# Patient Record
Sex: Male | Born: 1950 | Race: White | Hispanic: No | Smoking: Never smoker
Health system: Southern US, Community
[De-identification: ages and names within clinical notes are randomized; demographics above are authoritative.]

## PROBLEM LIST (undated history)

## (undated) DIAGNOSIS — I10 Essential (primary) hypertension: Secondary | ICD-10-CM

## (undated) HISTORY — PX: APPENDECTOMY: SHX54

---

## 2009-06-12 ENCOUNTER — Encounter: Payer: Self-pay | Admitting: Emergency Medicine

## 2009-06-12 ENCOUNTER — Inpatient Hospital Stay (HOSPITAL_COMMUNITY): Admission: EM | Admit: 2009-06-12 | Discharge: 2009-06-13 | Payer: Self-pay | Admitting: Internal Medicine

## 2009-06-12 ENCOUNTER — Ambulatory Visit: Payer: Self-pay | Admitting: Diagnostic Radiology

## 2009-09-19 ENCOUNTER — Emergency Department (HOSPITAL_COMMUNITY): Admission: EM | Admit: 2009-09-19 | Discharge: 2009-09-23 | Payer: Self-pay | Admitting: Emergency Medicine

## 2009-09-21 DIAGNOSIS — F3110 Bipolar disorder, current episode manic without psychotic features, unspecified: Secondary | ICD-10-CM

## 2009-12-09 ENCOUNTER — Ambulatory Visit: Payer: Self-pay | Admitting: Radiology

## 2009-12-09 ENCOUNTER — Emergency Department (HOSPITAL_BASED_OUTPATIENT_CLINIC_OR_DEPARTMENT_OTHER): Admission: EM | Admit: 2009-12-09 | Discharge: 2009-12-09 | Payer: Self-pay | Admitting: Emergency Medicine

## 2010-01-09 ENCOUNTER — Ambulatory Visit: Payer: Self-pay | Admitting: Psychiatry

## 2010-05-19 LAB — CBC
HCT: 45.9 % (ref 39.0–52.0)
MCH: 32.2 pg (ref 26.0–34.0)
MCV: 92.3 fL (ref 78.0–100.0)
RBC: 4.98 MIL/uL (ref 4.22–5.81)
RDW: 12.3 % (ref 11.5–15.5)
WBC: 9.8 10*3/uL (ref 4.0–10.5)

## 2010-05-19 LAB — DIFFERENTIAL
Eosinophils Absolute: 0.7 10*3/uL (ref 0.0–0.7)
Eosinophils Relative: 7 % — ABNORMAL HIGH (ref 0–5)
Lymphocytes Relative: 31 % (ref 12–46)
Lymphs Abs: 3 10*3/uL (ref 0.7–4.0)
Monocytes Absolute: 0.7 10*3/uL (ref 0.1–1.0)

## 2010-05-19 LAB — URINALYSIS, ROUTINE W REFLEX MICROSCOPIC
Bilirubin Urine: NEGATIVE
Glucose, UA: NEGATIVE mg/dL
Ketones, ur: NEGATIVE mg/dL
pH: 6.5 (ref 5.0–8.0)

## 2010-05-19 LAB — RAPID URINE DRUG SCREEN, HOSP PERFORMED
Amphetamines: NOT DETECTED
Benzodiazepines: POSITIVE — AB
Cocaine: NOT DETECTED
Tetrahydrocannabinol: NOT DETECTED

## 2010-05-19 LAB — BASIC METABOLIC PANEL
Calcium: 9.2 mg/dL (ref 8.4–10.5)
Creatinine, Ser: 1.05 mg/dL (ref 0.4–1.5)
GFR calc Af Amer: >60 mL/min (ref >60)
GFR calc non Af Amer: >60 mL/min (ref >60)

## 2010-05-23 LAB — BASIC METABOLIC PANEL
Calcium: 9 mg/dL (ref 8.4–10.5)
GFR calc Af Amer: >60 mL/min (ref >60)
GFR calc non Af Amer: >60 mL/min (ref >60)
Glucose, Bld: 120 mg/dL — ABNORMAL HIGH (ref 70–99)
Sodium: 140 mEq/L (ref 135–145)

## 2010-05-23 LAB — POCT CARDIAC MARKERS
CKMB, poc: 2 ng/mL (ref 1.0–8.0)
Myoglobin, poc: 48.9 ng/mL (ref 12–200)
Myoglobin, poc: 75.2 ng/mL (ref 12–200)

## 2010-05-23 LAB — LIPID PANEL
HDL: 44 mg/dL (ref >39)
Total CHOL/HDL Ratio: 4.3 RATIO
Triglycerides: 252 mg/dL — ABNORMAL HIGH (ref <150)
VLDL: 50 mg/dL — ABNORMAL HIGH (ref 0–40)

## 2010-05-23 LAB — CBC
Hemoglobin: 15.3 g/dL (ref 13.0–17.0)
RDW: 11.4 % — ABNORMAL LOW (ref 11.5–15.5)

## 2010-05-23 LAB — TROPONIN I: Troponin I: <0.01 ng/mL (ref 0.00–0.06)

## 2010-05-23 LAB — DIFFERENTIAL
Basophils Absolute: 0.2 10*3/uL — ABNORMAL HIGH (ref 0.0–0.1)
Lymphocytes Relative: 28 % (ref 12–46)
Monocytes Absolute: 0.8 10*3/uL (ref 0.1–1.0)
Neutro Abs: 6.5 10*3/uL (ref 1.7–7.7)

## 2010-05-23 LAB — HEMOGLOBIN A1C
Hgb A1c MFr Bld: 5.5 % (ref 4.6–6.1)
Mean Plasma Glucose: 120 mg/dL

## 2010-05-23 LAB — CK TOTAL AND CKMB (NOT AT ARMC)
CK, MB: 2 ng/mL (ref 0.3–4.0)
Relative Index: 1.4 (ref 0.0–2.5)
Total CK: 148 U/L (ref 7–232)

## 2010-05-23 LAB — PHENYTOIN LEVEL, TOTAL: Phenytoin Lvl: 10.3 ug/mL (ref 10.0–20.0)

## 2017-04-17 ENCOUNTER — Other Ambulatory Visit: Payer: Self-pay

## 2017-04-17 ENCOUNTER — Emergency Department (HOSPITAL_BASED_OUTPATIENT_CLINIC_OR_DEPARTMENT_OTHER)
Admission: EM | Admit: 2017-04-17 | Discharge: 2017-04-17 | Disposition: A | Discharge status: Home / Self Care | Payer: Self-pay | Attending: Emergency Medicine | Admitting: Emergency Medicine

## 2017-04-17 ENCOUNTER — Emergency Department (HOSPITAL_BASED_OUTPATIENT_CLINIC_OR_DEPARTMENT_OTHER): Payer: Self-pay

## 2017-04-17 ENCOUNTER — Encounter (HOSPITAL_BASED_OUTPATIENT_CLINIC_OR_DEPARTMENT_OTHER): Payer: Self-pay | Admitting: Emergency Medicine

## 2017-04-17 DIAGNOSIS — R42 Dizziness and giddiness: Secondary | ICD-10-CM | POA: Insufficient documentation

## 2017-04-17 DIAGNOSIS — Z79899 Other long term (current) drug therapy: Secondary | ICD-10-CM | POA: Insufficient documentation

## 2017-04-17 DIAGNOSIS — I10 Essential (primary) hypertension: Secondary | ICD-10-CM | POA: Insufficient documentation

## 2017-04-17 DIAGNOSIS — J111 Influenza due to unidentified influenza virus with other respiratory manifestations: Secondary | ICD-10-CM | POA: Insufficient documentation

## 2017-04-17 DIAGNOSIS — R69 Illness, unspecified: Secondary | ICD-10-CM

## 2017-04-17 HISTORY — DX: Essential (primary) hypertension: I10

## 2017-04-17 LAB — CBC WITH DIFFERENTIAL/PLATELET
Basophils Absolute: 0 10*3/uL (ref 0.0–0.1)
Basophils Relative: 0 %
EOS ABS: 0 10*3/uL (ref 0.0–0.7)
EOS PCT: 0 %
HCT: 41.8 % (ref 39.0–52.0)
Hemoglobin: 14.8 g/dL (ref 13.0–17.0)
LYMPHS ABS: 1.1 10*3/uL (ref 0.7–4.0)
LYMPHS PCT: 16 %
MCH: 30.1 pg (ref 26.0–34.0)
MCHC: 35.4 g/dL (ref 30.0–36.0)
MCV: 85.1 fL (ref 78.0–100.0)
MONO ABS: 0.8 10*3/uL (ref 0.1–1.0)
MONOS PCT: 12 %
Neutro Abs: 5.1 10*3/uL (ref 1.7–7.7)
Neutrophils Relative %: 72 %
PLATELETS: 143 10*3/uL — AB (ref 150–400)
RBC: 4.91 MIL/uL (ref 4.22–5.81)
RDW: 13 % (ref 11.5–15.5)
WBC: 7.1 10*3/uL (ref 4.0–10.5)

## 2017-04-17 LAB — COMPREHENSIVE METABOLIC PANEL
ALK PHOS: 47 U/L (ref 38–126)
ALT: 68 U/L — ABNORMAL HIGH (ref 17–63)
ANION GAP: 10 (ref 5–15)
AST: 58 U/L — ABNORMAL HIGH (ref 15–41)
Albumin: 3.9 g/dL (ref 3.5–5.0)
BILIRUBIN TOTAL: 0.9 mg/dL (ref 0.3–1.2)
BUN: 19 mg/dL (ref 6–20)
CALCIUM: 8.5 mg/dL — AB (ref 8.9–10.3)
CO2: 25 mmol/L (ref 22–32)
Chloride: 99 mmol/L — ABNORMAL LOW (ref 101–111)
Creatinine, Ser: 1.09 mg/dL (ref 0.61–1.24)
GFR calc Af Amer: >60 mL/min (ref >60)
GFR calc non Af Amer: >60 mL/min (ref >60)
Glucose, Bld: 113 mg/dL — ABNORMAL HIGH (ref 65–99)
Potassium: 3.6 mmol/L (ref 3.5–5.1)
SODIUM: 134 mmol/L — AB (ref 135–145)
TOTAL PROTEIN: 7.2 g/dL (ref 6.5–8.1)

## 2017-04-17 LAB — TROPONIN I

## 2017-04-17 LAB — CBG MONITORING, ED: Glucose-Capillary: 98 mg/dL (ref 65–99)

## 2017-04-17 MED ORDER — SODIUM CHLORIDE 0.9 % IV BOLUS (SEPSIS)
1000.0000 mL | Freq: Once | INTRAVENOUS | Status: AC
  Administered 2017-04-17: 1000 mL via INTRAVENOUS

## 2017-04-17 NOTE — Discharge Instructions (Signed)
Your blood work shows that your liver enzymes are slightly high.  Please limit your tylenol use (do not use if possible) and do not drink alcohol.  Please follow up with your doctor in the next 1-2 days.  Please make sure you stay well hydrated.    Please be slow when going from sitting to standing or with position changes in general to allow your body time to adjust.

## 2017-04-17 NOTE — ED Provider Notes (Signed)
[facility] EMERGENCY DEPARTMENT Provider Note   CSN: [id number] Arrival date & time: 04/17/17  0931     History   Chief Complaint Chief Complaint  Patient presents with  . Generalized Body Aches    HPI Logan Bryant is a 67 y.o. male with a history of PTSD, hypertension, who presents today for evaluation of fevers and chills.  He reports that his symptoms started on Monday and have been gradually getting worse.  He reports that yesterday his temperature at home was 100.4.  He endorses cough, generalized ache in all of his joints, decreased appetite, poor p.o. intake.  He reports that today he decided to come in as he was feeling lightheaded with any position changes.  He reports that he fell twice because of this, both times onto his couch.  He denies any injuries, no headache, no neck pain.  He denies any visual changes.  He denies chest pain or shortness of breath.    HPI  Past Medical History:  Diagnosis Date  . Hypertension     There are no active problems to display for this patient.   History reviewed. No pertinent surgical history.     Home Medications    Prior to Admission medications   Medication Sig Start Date End Date Taking? Authorizing Provider  buPROPion (WELLBUTRIN) 100 MG tablet Take 150 mg by mouth 2 (two) times daily.   Yes [provider]  sertraline (ZOLOFT) 100 MG tablet Take 200 mg by mouth daily.   Yes [provider]    Family History History reviewed. No pertinent family history.  Social History Social History   Tobacco Use  . Smoking status: Never Smoker  . Smokeless tobacco: Never Used  Substance Use Topics  . Alcohol use: No    Frequency: Never  . Drug use: No     Allergies   Patient has no known allergies.   Review of Systems Review of Systems  Constitutional: Positive for activity change, appetite change, chills, fatigue and fever.  HENT: Negative for ear pain and sore throat.   Eyes: Negative  for pain and visual disturbance.  Respiratory: Negative for cough and shortness of breath.   Cardiovascular: Negative for chest pain and palpitations.  Gastrointestinal: Negative for abdominal pain, diarrhea, nausea and vomiting.  Genitourinary: Positive for decreased urine volume. Negative for discharge, dysuria and hematuria.  Musculoskeletal: Negative for arthralgias and back pain.  Skin: Negative for color change and rash.  Neurological: Negative for seizures and syncope.  All other systems reviewed and are negative.    Physical Exam Updated Vital Signs BP (!) 143/70   Pulse 72   Temp 98.1 F (36.7 C) (Oral)   Resp 17   Ht 5\' 11"  (1.803 m)   Wt 95.3 kg (210 lb)   SpO2 93%   BMI 29.29 kg/m   Physical Exam  Constitutional: He is oriented to person, place, and time. He appears well-developed and well-nourished. No distress.  HENT:  Head: Normocephalic and atraumatic.  Mouth/Throat: Oropharynx is clear and moist.  Eyes: Conjunctivae are normal. Right eye exhibits no discharge. Left eye exhibits no discharge. No scleral icterus.  Neck: Normal range of motion.  Cardiovascular: Normal rate, regular rhythm and normal heart sounds.  No murmur heard. Pulmonary/Chest: Effort normal. No stridor. No respiratory distress.  Abdominal: He exhibits no distension.  Musculoskeletal: He exhibits no edema or deformity.  Neurological: He is alert and oriented to person, place, and time. He exhibits normal muscle tone.  Mental Status:  Alert, oriented, thought content appropriate, able to give a coherent history. Speech fluent without evidence of aphasia. Able to follow 2 step commands without difficulty.  Cranial Nerves:  II:  Peripheral visual fields grossly normal, pupils equal, round, reactive to light III,IV, VI: ptosis not present, extra-ocular motions intact bilaterally  V,VII: smile symmetric, facial light touch sensation equal VIII: hearing grossly normal to voice  X: uvula  elevates symmetrically  XI: bilateral shoulder shrug symmetric and strong XII: midline tongue extension without fassiculations Motor:  Normal tone. 5/5 in upper and lower extremities bilaterally including strong and equal grip strength and dorsiflexion/plantar flexion Cerebellar: normal finger-to-nose with bilateral upper extremities Gait: normal gait and balance CV: distal pulses palpable throughout    Skin: Skin is warm and dry. He is not diaphoretic.  Psychiatric: He has a normal mood and affect. His behavior is normal.  Nursing note and vitals reviewed.    ED Treatments / Results  Labs (all labs ordered are listed, but only abnormal results are displayed) Labs Reviewed  COMPREHENSIVE METABOLIC PANEL - Abnormal; Notable for the following components:      Result Value   Sodium 134 (*)    Chloride 99 (*)    Glucose, Bld 113 (*)    Calcium 8.5 (*)    AST 58 (*)    ALT 68 (*)    All other components within normal limits  CBC WITH DIFFERENTIAL/PLATELET - Abnormal; Notable for the following components:   Platelets 143 (*)    All other components within normal limits  TROPONIN I  CBG MONITORING, ED    EKG  EKG Interpretation  Date/Time:  Thursday April 17 2017 10:14:33 EST Ventricular Rate:  70 PR Interval:    QRS Duration: 87 QT Interval:  399 QTC Calculation: 431 R Axis:   64 Text Interpretation:  Sinus rhythm Borderline low voltage, extremity leads No significant change since last tracing Confirmed by Frederick Peers [id number]) on 04/17/2017 10:21:24 AM Also confirmed by Frederick Peers [id number]), editor Barbette Hair [phone])  on 04/17/2017 11:16:13 AM       Radiology Dg Chest 2 View  Result Date: 04/17/2017 CLINICAL DATA:  Cough, fevers EXAM: CHEST  2 VIEW COMPARISON:  06/12/2009 FINDINGS: Heart and mediastinal contours are within normal limits. No focal opacities or effusions. No acute bony abnormality. IMPRESSION: No active cardiopulmonary disease. Electronically  Signed   By: Charlett Nose M.D.   On: 04/17/2017 10:24   Ct Head Wo Contrast  Result Date: 04/17/2017 CLINICAL DATA:  Disease this for several weeks.  Falls. EXAM: CT HEAD WITHOUT CONTRAST CT CERVICAL SPINE WITHOUT CONTRAST TECHNIQUE: Multidetector CT imaging of the head and cervical spine was performed following the standard protocol without intravenous contrast. Multiplanar CT image reconstructions of the cervical spine were also generated. COMPARISON:  None. FINDINGS: CT HEAD FINDINGS Brain: The brainstem, cerebellum, cerebral peduncles, thalami, basal ganglia, basilar cisterns, and ventricular system appear within normal limits. No intracranial hemorrhage, mass lesion, or acute CVA. Vascular: Unremarkable Skull: Unremarkable Sinuses/Orbits: Chronic bilateral maxillary, ethmoid, and sphenoid sinusitis. Other: No supplemental non-categorized findings. CT CERVICAL SPINE FINDINGS Alignment: No significant abnormal subluxation. Reversal of the normal cervical lordosis Skull base and vertebrae: No cervical spine fracture identified. Significant loss of intervertebral disc height at C5-6 and C6-7. Multilevel spondylosis. Soft tissues and spinal canal: No prevertebral soft tissue swelling. Scattered small lymph nodes in the neck bilaterally. Disc levels: Uncinate and facet spurring contribute to mild right foraminal stenosis at C3-4, C5-6,  and C6-7; and mild bony left foraminal stenosis at C3-4 and C5-6. Posterior osseous ridging contributes to borderline central narrowing of the thecal sac at C5-6 and C6-7. Upper chest: The lungs are not included. Other: No supplemental non-categorized findings. IMPRESSION: 1. No acute intracranial findings or acute cervical spine findings. 2. Chronic paranasal sinusitis. 3. Cervical spondylosis causing mild degrees of impingement at C3-4, C5-6, and C6-7. 4. Loss of the normal cervical lordosis, which can be associated with muscle spasm. Electronically Signed   By: Gaylyn Rong M.D.   On: 04/17/2017 10:38   Ct Cervical Spine Wo Contrast  Result Date: 04/17/2017 CLINICAL DATA:  Disease this for several weeks.  Falls. EXAM: CT HEAD WITHOUT CONTRAST CT CERVICAL SPINE WITHOUT CONTRAST TECHNIQUE: Multidetector CT imaging of the head and cervical spine was performed following the standard protocol without intravenous contrast. Multiplanar CT image reconstructions of the cervical spine were also generated. COMPARISON:  None. FINDINGS: CT HEAD FINDINGS Brain: The brainstem, cerebellum, cerebral peduncles, thalami, basal ganglia, basilar cisterns, and ventricular system appear within normal limits. No intracranial hemorrhage, mass lesion, or acute CVA. Vascular: Unremarkable Skull: Unremarkable Sinuses/Orbits: Chronic bilateral maxillary, ethmoid, and sphenoid sinusitis. Other: No supplemental non-categorized findings. CT CERVICAL SPINE FINDINGS Alignment: No significant abnormal subluxation. Reversal of the normal cervical lordosis Skull base and vertebrae: No cervical spine fracture identified. Significant loss of intervertebral disc height at C5-6 and C6-7. Multilevel spondylosis. Soft tissues and spinal canal: No prevertebral soft tissue swelling. Scattered small lymph nodes in the neck bilaterally. Disc levels: Uncinate and facet spurring contribute to mild right foraminal stenosis at C3-4, C5-6, and C6-7; and mild bony left foraminal stenosis at C3-4 and C5-6. Posterior osseous ridging contributes to borderline central narrowing of the thecal sac at C5-6 and C6-7. Upper chest: The lungs are not included. Other: No supplemental non-categorized findings. IMPRESSION: 1. No acute intracranial findings or acute cervical spine findings. 2. Chronic paranasal sinusitis. 3. Cervical spondylosis causing mild degrees of impingement at C3-4, C5-6, and C6-7. 4. Loss of the normal cervical lordosis, which can be associated with muscle spasm. Electronically Signed   By: Gaylyn Rong  M.D.   On: 04/17/2017 10:38    Procedures Procedures (including critical care time)  Medications Ordered in ED Medications  sodium chloride 0.9 % bolus 1,000 mL (0 mLs Intravenous Stopped 04/17/17 1155)  sodium chloride 0.9 % bolus 1,000 mL (0 mLs Intravenous Stopped 04/17/17 1359)     Initial Impression / Assessment and Plan / ED Course  I have reviewed the triage vital signs and the nursing notes.  Pertinent labs & imaging results that were available during my care of the patient were reviewed by me and considered in my medical decision making (see chart for details).  Clinical Course as of Apr 18 1739  Thu Apr 17, 2017  1149 Patient re-evaluated, does not feel less dizzy after 1 liter, will order second liter and orthostatic vitals  [EH]  1346 Patient reevaluated, his dizziness has fully resolved, he has urinated.  He is requesting discharge at this time.  [EH]    Clinical Course User Index [EH] Cristina Gong, PA-C   Patient with symptoms consistent with influenza.  Vitals are stable, low-grade fever.  No signs of dehydration, tolerating PO's.  Lungs are clear CXR with out acute abnormalities.   Discussed the cost versus benefit of Tamiflu treatment with the patient.  The patient understands that symptoms are greater than the recommended 24-48 hour window of treatment.  Patient will be discharged with instructions to orally hydrate, rest, and use over-the-counter medications such as anti-inflammatories ibuprofen and Aleve for muscle aches and Tylenol for fever.    Based on his falls, and dizziness combined with age EKG was obtained, normal troponin.  Head CT obtained along with CT neck with out acute abnormalities.  He was treated with IVF after which his dizziness resolved.  He was seen as a shared visit with Dr. Clarene Duke who agreed with my plan.  PCP follow up, return precautions discussed. Informed of elevated LFT, given instructions to avoid alcohol, limit tylenol use and PCP  follow up.    Final Clinical Impressions(s) / ED Diagnoses   Final diagnoses:  Influenza-like illness    ED Discharge Orders    None       Norman Clay 04/17/17 1743    Little, Ambrose Finland, MD 04/19/17 1610

## 2017-04-17 NOTE — ED Triage Notes (Signed)
Patient reports fever and chills since Monday.  Denies n/v/d.  Reports lack of appetite.  States fever at home of 104.  Reports generalized body aches and cough.

## 2018-03-11 ENCOUNTER — Encounter (HOSPITAL_BASED_OUTPATIENT_CLINIC_OR_DEPARTMENT_OTHER): Payer: Self-pay | Admitting: *Deleted

## 2018-03-11 ENCOUNTER — Emergency Department (HOSPITAL_BASED_OUTPATIENT_CLINIC_OR_DEPARTMENT_OTHER)
Admission: EM | Admit: 2018-03-11 | Discharge: 2018-03-11 | Disposition: A | Discharge status: Home / Self Care | Payer: No Typology Code available for payment source | Attending: Emergency Medicine | Admitting: Emergency Medicine

## 2018-03-11 ENCOUNTER — Other Ambulatory Visit: Payer: Self-pay

## 2018-03-11 DIAGNOSIS — I10 Essential (primary) hypertension: Secondary | ICD-10-CM | POA: Insufficient documentation

## 2018-03-11 DIAGNOSIS — T7840XA Allergy, unspecified, initial encounter: Secondary | ICD-10-CM | POA: Insufficient documentation

## 2018-03-11 DIAGNOSIS — B379 Candidiasis, unspecified: Secondary | ICD-10-CM | POA: Diagnosis not present

## 2018-03-11 DIAGNOSIS — R21 Rash and other nonspecific skin eruption: Secondary | ICD-10-CM | POA: Diagnosis present

## 2018-03-11 DIAGNOSIS — L299 Pruritus, unspecified: Secondary | ICD-10-CM | POA: Diagnosis not present

## 2018-03-11 DIAGNOSIS — Z79899 Other long term (current) drug therapy: Secondary | ICD-10-CM | POA: Insufficient documentation

## 2018-03-11 MED ORDER — PREDNISONE 20 MG PO TABS: 40.0000 mg | ORAL_TABLET | Freq: Every day | ORAL | 0 refills | Status: AC

## 2018-03-11 MED ORDER — FLUCONAZOLE 150 MG PO TABS: 150.0000 mg | ORAL_TABLET | Freq: Every day | ORAL | 0 refills | Status: AC

## 2018-03-11 MED ORDER — DIPHENHYDRAMINE HCL 25 MG PO TABS: 25.0000 mg | ORAL_TABLET | Freq: Four times a day (QID) | ORAL | 0 refills | Status: AC | PRN

## 2018-03-11 NOTE — ED Notes (Signed)
ED Provider at bedside. 

## 2018-03-11 NOTE — ED Provider Notes (Signed)
[facility] EMERGENCY DEPARTMENT Provider Note   CSN: [id number] Arrival date & time: 03/11/18  1257     History   Chief Complaint Chief Complaint  Patient presents with  . Allergic Reaction    HPI Logan Bryant is a 68 y.o. male with a past medical history significant for HTN who presents today complaining of rash for the past few days after he was started on new medication. Patient was started on Doxycycline for possible pneumonia at the [place] by PCP.  Patient stopped taking the medication on Thursday so developing a rash and pruritus on Friday.  Patient stopped taking the medication on Friday however the rash continue to spread with significant itching over the weekend.  [facility] in [facility] for further evaluation.  Patient also reports redness and discomfort around his penis consistent with prior fungal infection. Patient denies any difficulty breathing, swelling, wheezing, oral lesions.  Patient denies any chest pain, vision change, diarrhea, nausea or vomiting.  HPI  Past Medical History:  Diagnosis Date  . Hypertension     There are no active problems to display for this patient.   Past Surgical History:  Procedure Laterality Date  . APPENDECTOMY          Home Medications    Prior to Admission medications   Medication Sig Start Date End Date Taking? Authorizing Provider  lisinopril (PRINIVIL,ZESTRIL) 20 MG tablet Take 20 mg by mouth daily.   Yes [provider]  buPROPion (WELLBUTRIN) 100 MG tablet Take 150 mg by mouth 2 (two) times daily.    [provider]  diphenhydrAMINE (BENADRYL ALLERGY) 25 MG tablet Take 1 tablet (25 mg total) by mouth every 6 (six) hours as needed for itching or allergies. 03/11/18   Simaya Lumadue, Lilia Argue, MD  fluconazole (DIFLUCAN) 150 MG tablet Take 1 tablet (150 mg total) by mouth daily. 03/11/18   Chelise Hanger, Lilia Argue, MD  predniSONE (DELTASONE) 20 MG tablet Take 2 tablets  (40 mg total) by mouth daily for 5 days. 03/11/18 03/16/18  Sheera Illingworth, Lilia Argue, MD  sertraline (ZOLOFT) 100 MG tablet Take 200 mg by mouth daily.    [provider]    Family History History reviewed. No pertinent family history.  Social History Social History   Tobacco Use  . Smoking status: Never Smoker  . Smokeless tobacco: Never Used  Substance Use Topics  . Alcohol use: No    Frequency: Never  . Drug use: No     Allergies   Patient has no known allergies.   Review of Systems Review of Systems  Constitutional: Negative.   HENT: Negative.   Eyes: Negative.   Respiratory: Negative.   Cardiovascular: Negative.   Genitourinary: Negative.   Skin: Positive for rash.     Physical Exam Updated Vital Signs BP (!) 155/95   Pulse 66   Temp 98 F (36.7 C)   Resp 18   Ht 5\' 10"  (1.778 m)   Wt 99.8 kg   SpO2 99%   BMI 31.57 kg/m   Physical Exam Constitutional:      Appearance: Normal appearance.  HENT:     Head: Normocephalic and atraumatic.     Nose: Nose normal.     Mouth/Throat:     Mouth: Mucous membranes are moist.     Pharynx: Oropharynx is clear.  Eyes:     Conjunctiva/sclera: Conjunctivae normal.     Pupils: Pupils are equal, round, and reactive to light.  Neck:     Musculoskeletal: Normal range of motion.  Cardiovascular:     Rate and Rhythm: Normal rate and regular rhythm.     Pulses: Normal pulses.  Pulmonary:     Effort: Pulmonary effort is normal.     Breath sounds: Normal breath sounds.  Abdominal:     General: Abdomen is flat. Bowel sounds are normal.  Genitourinary:    Comments: Multiple erythematous lesions noted on penis and groin area. Musculoskeletal: Normal range of motion.  Skin:    General: Skin is warm.     Capillary Refill: Capillary refill takes less than 2 seconds.     Comments: Diffuse macular rash associated with pruritus chest upper extremity and lower extremity.  Nonblanching in nature.  No drainage or fluctuance  associated with them.  Neurological:     General: No focal deficit present.     Mental Status: He is alert and oriented to person, place, and time.  Psychiatric:        Mood and Affect: Mood normal.     ED Treatments / Results  Labs (all labs ordered are listed, but only abnormal results are displayed) Labs Reviewed - No data to display  EKG None  Radiology No results found.  Procedures Procedures (including critical care time)  Medications Ordered in ED Medications - No data to display   Initial Impression / Assessment and Plan / ED Course  I have reviewed the triage vital signs and the nursing notes.  Pertinent labs & imaging results that were available during my care of the patient were reviewed by me and considered in my medical decision making (see chart for details).   Patient is a 68 year old male who presented with diffuse macular rash upper and lower extremities as well as torso after started on doxycycline a week ago.  Patient has displayed significant pruritus associated with rash.  Based on presentation suspect photosensitivity reaction.  No signs of anaphylaxis.  Vital signs are within normal limits and patient is well-appearing.  Low concern for SJS based on labs morphology.  Penile lesion consistent with fungal infection likely in the setting of antibiotic regimen.  Patient does have significant pruritus but will not be providing penicillin given the patient history himself.  Will prescribe steroid course for 5 days, Benadryl for pruritus, Diflucan for possible fungal infection.  Patient will follow-up with PCP at the [facility] if symptoms do not improve or worsen in the next few days.  Patient is in agreement with plan.  Final Clinical Impressions(s) / ED Diagnoses   Final diagnoses:  Allergic reaction to drug, initial encounter  Yeast infection    ED Discharge Orders         Ordered    predniSONE (DELTASONE) 20 MG tablet  Daily     03/11/18 1400    fluconazole  (DIFLUCAN) 150 MG tablet  Daily     03/11/18 1400    diphenhydrAMINE (BENADRYL ALLERGY) 25 MG tablet  Every 6 hours PRN     03/11/18 1400           Leith Hedlund, Lilia Argue, MD 03/11/18 1626    Alvira Monday, MD 03/13/18 [phone]

## 2018-03-11 NOTE — ED Triage Notes (Signed)
Pt c/o rash after taking doxy x 1 week

## 2018-03-11 NOTE — Discharge Instructions (Signed)
You are having a reaction to the antibiotic you were prescribed last week. Please make sure you stop taking the medication.you will be on a short course of prednisone and benadryl to help with the itching. We will also prescribe an antifungal medication that you will need to take only once. If symptoms persists you will need to make an appointment to see your primary care physician.

## 2019-04-30 ENCOUNTER — Ambulatory Visit: Payer: No Typology Code available for payment source

## 2019-05-29 IMAGING — CT CT CERVICAL SPINE W/O CM
4 of 7 series · 15 of 33 positions shown, 16 images · non-contrast
Comparison: None.

CLINICAL DATA: Disease this for several weeks.  Falls.

EXAM:
CT HEAD WITHOUT CONTRAST
CT CERVICAL SPINE WITHOUT CONTRAST
TECHNIQUE: Multidetector CT imaging of the head and cervical spine was
performed following the standard protocol without intravenous
contrast. Multiplanar CT image reconstructions of the cervical spine
were also generated.

[Series 4: head 3.0 mpr cor · coronal · 0.32mm/px · 3 of 72 slices shown]
[im 18/72  bone]
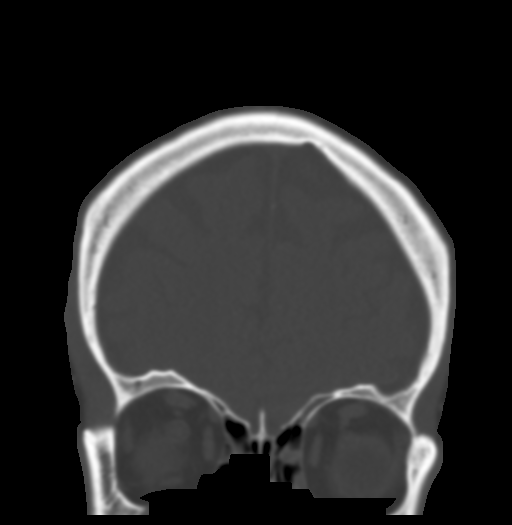
[im 36/72  bone]
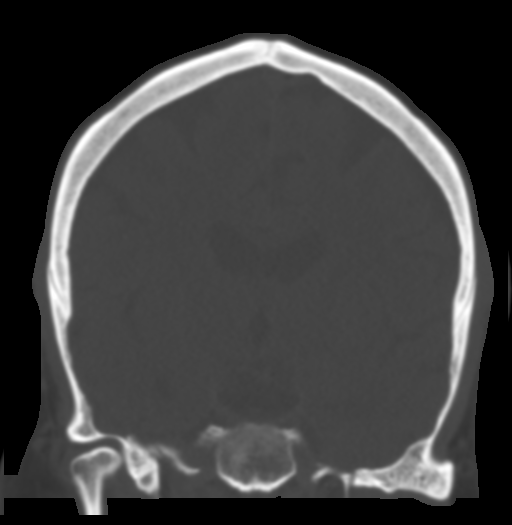
[im 54/72  bone]
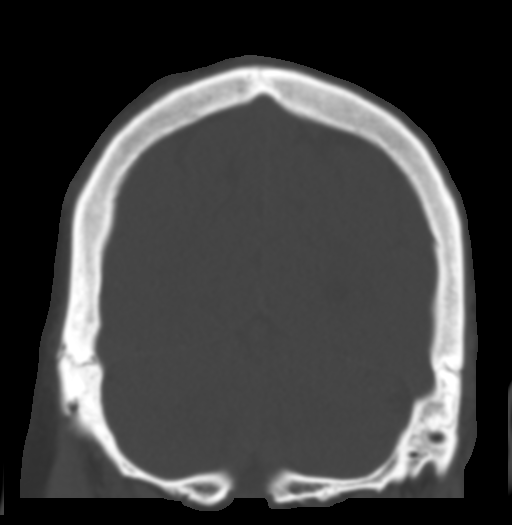

[Series 7: c_spine 2.0 i30s 3 · axial · 0.36mm/px · z∈[-249,-165]mm · 3 of 86 slices shown]
[im 22/86  bone]
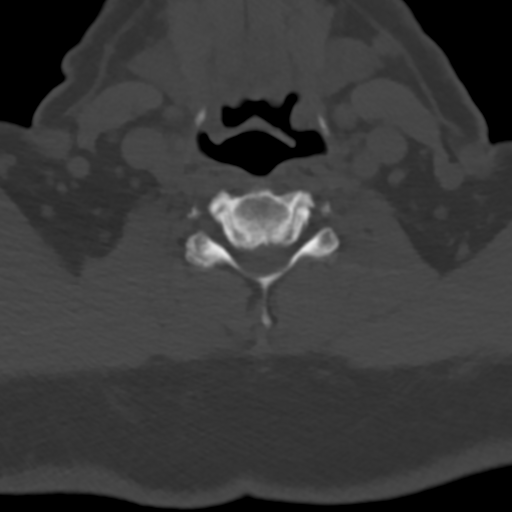
[im 43/86  bone]
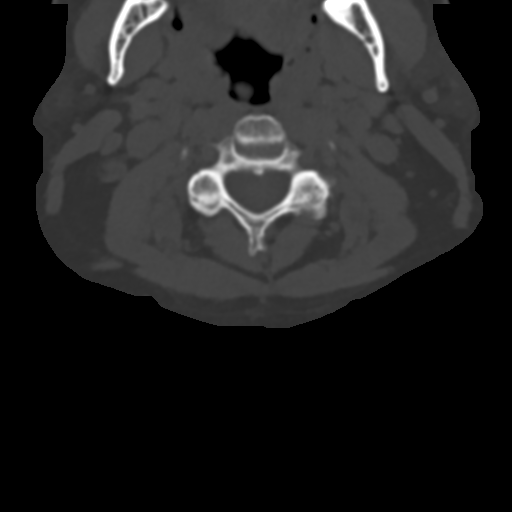
[im 64/86  bone]
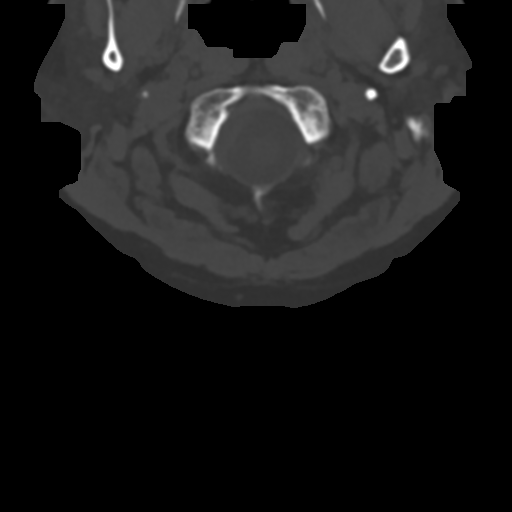

[Series 10: sagittals · sagittal · 0.29mm/px · 5 of 61 slices shown]
[im 11/61  bone]
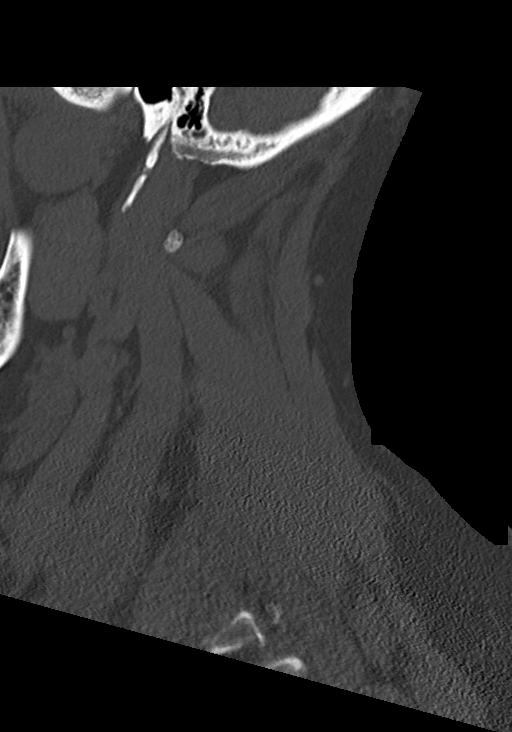
[im 21/61  bone]
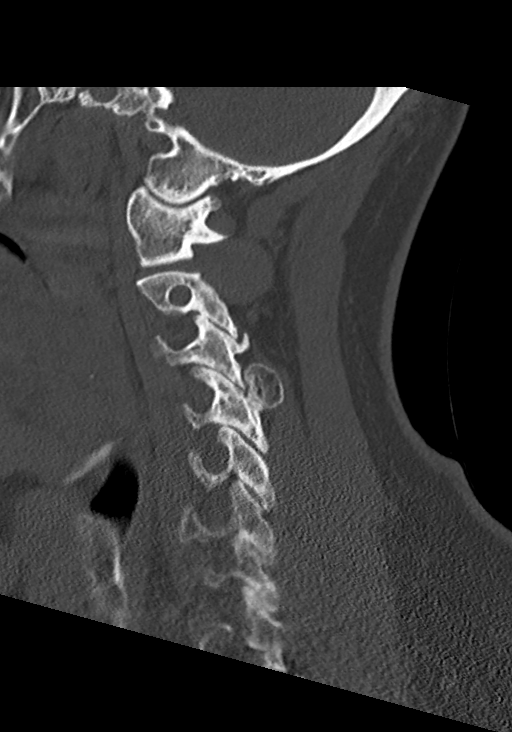
[im 31/61  bone]
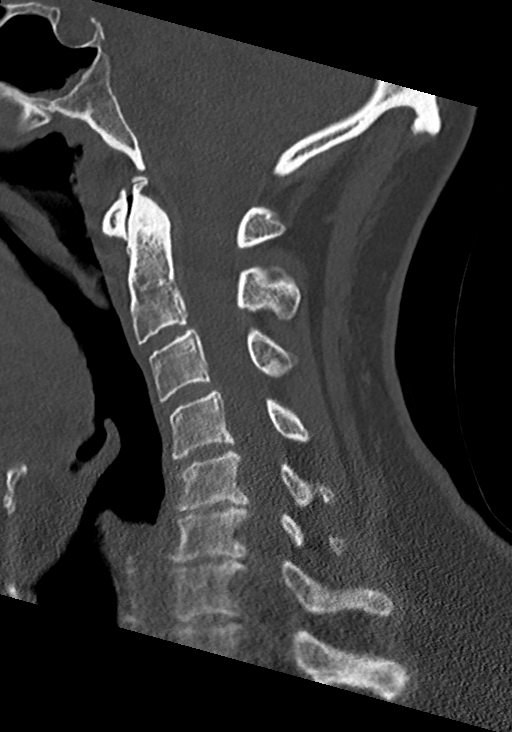
[im 41/61  bone]
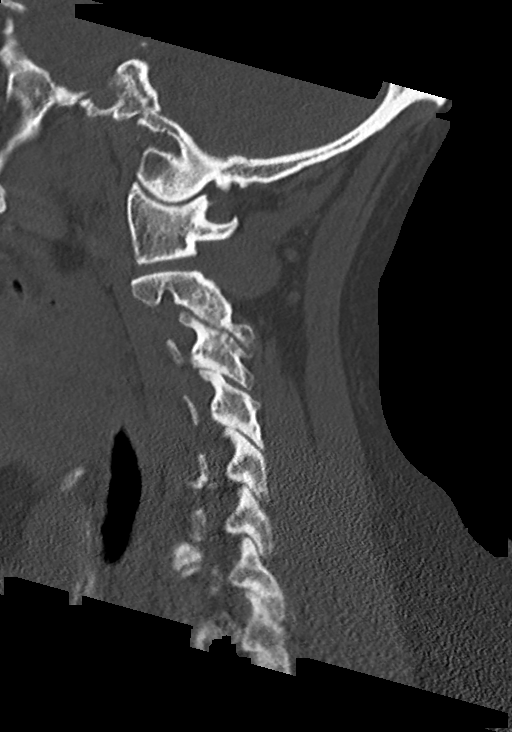
[im 51/61  bone]
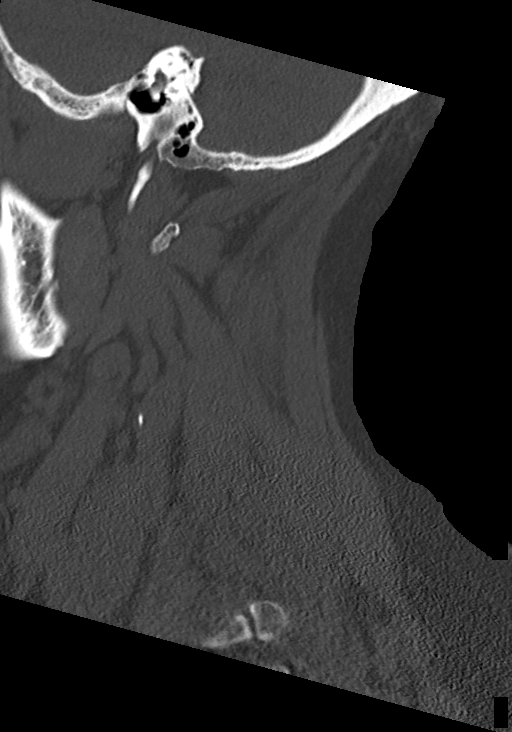

[Series 11: orthogonals · axial · 0.28mm/px · z∈[-298,-186]mm · 4 of 100 slices shown, 5 images]
[im 20/100  soft-tissue]
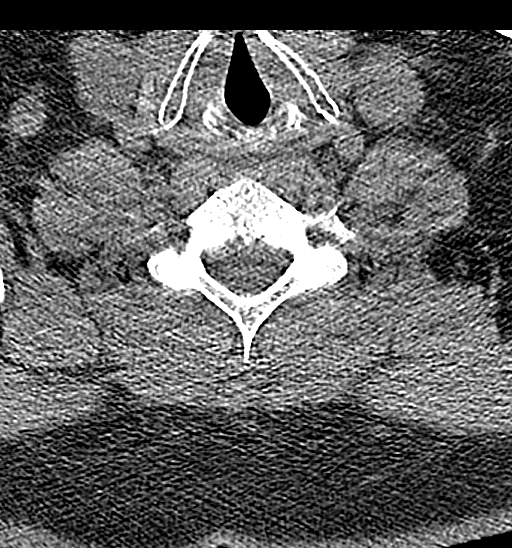
[im 20/100  bone]
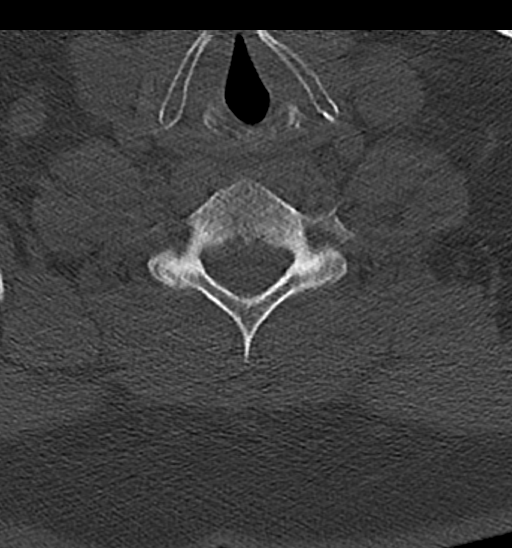
[im 40/100  bone]
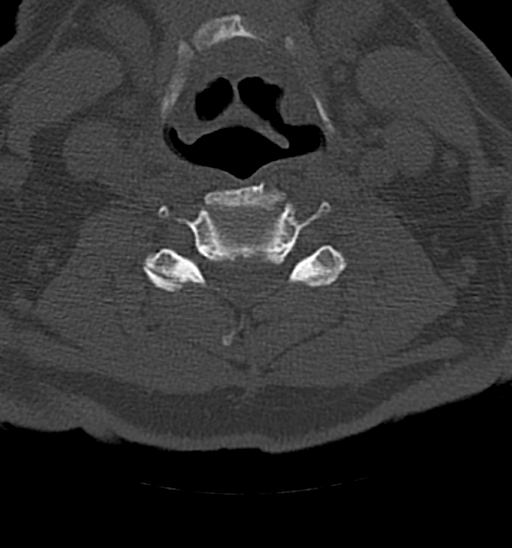
[im 60/100  bone]
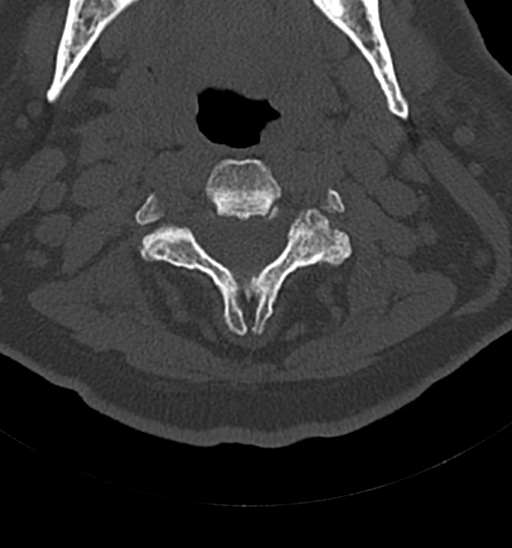
[im 80/100  bone]
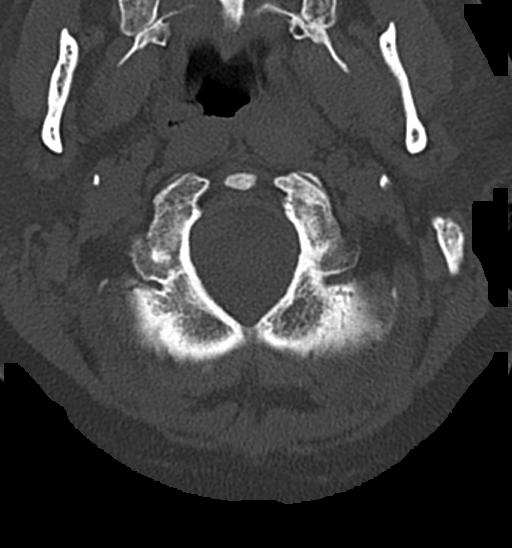

[15 of 33 positions shown; findings below may reference images not displayed]

FINDINGS: CT HEAD FINDINGS

Brain: The brainstem, cerebellum, cerebral peduncles, thalami, basal
ganglia, basilar cisterns, and ventricular system appear within
normal limits. No intracranial hemorrhage, mass lesion, or acute
CVA.

Vascular: Unremarkable

Skull: Unremarkable

Sinuses/Orbits: Chronic bilateral maxillary, ethmoid, and sphenoid
sinusitis.

Other: No supplemental non-categorized findings.

CT CERVICAL SPINE FINDINGS

Alignment: No significant abnormal subluxation.. Reversal of the
normal cervical lordosis

Skull base and vertebrae: No cervical spine fracture identified.
Significant loss of intervertebral disc height at C5-6 and C6-7.
Multilevel spondylosis.

Soft tissues and spinal canal: No prevertebral soft tissue swelling.
Scattered small lymph nodes in the neck bilaterally.

Disc levels: Uncinate and facet spurring contribute to mild right
foraminal stenosis at C3-4, C5-6, and C6-7; and mild bony left
foraminal stenosis at C3-4 and C5-6. Posterior osseous ridging
contributes to borderline central narrowing of the thecal sac at
C5-6 and C6-7.

Upper chest: The lungs are not included.

Other: No supplemental non-categorized findings.
IMPRESSION: 1. No acute intracranial findings or acute cervical spine findings.
2. Chronic paranasal sinusitis.
3. Cervical spondylosis causing mild degrees of impingement at C3-4,
C5-6, and C6-7.
4. Loss of the normal cervical lordosis, which can be associated
with muscle spasm.
